# Patient Record
Sex: Female | Born: 1990 | Race: White | Hispanic: No | Marital: Single | Smoking: Never smoker
Health system: Southern US, Community
[De-identification: ages and names within clinical notes are randomized; demographics above are authoritative.]

## PROBLEM LIST (undated history)

## (undated) HISTORY — PX: WISDOM TOOTH EXTRACTION: SHX21

---
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
## 2012-11-18 ENCOUNTER — Encounter (HOSPITAL_COMMUNITY): Payer: Self-pay | Admitting: Emergency Medicine

## 2012-11-18 ENCOUNTER — Emergency Department (HOSPITAL_COMMUNITY)
Admission: EM | Admit: 2012-11-18 | Discharge: 2012-11-19 | Disposition: A | Discharge status: Home / Self Care | Payer: 59 | Attending: Emergency Medicine | Admitting: Emergency Medicine

## 2012-11-18 DIAGNOSIS — H5711 Ocular pain, right eye: Secondary | ICD-10-CM

## 2012-11-18 DIAGNOSIS — H5710 Ocular pain, unspecified eye: Secondary | ICD-10-CM | POA: Insufficient documentation

## 2012-11-18 NOTE — ED Notes (Signed)
Pt states she has been having problems with her eyes for the past couple of months  Pt states she went to the dr and got drops for them and her left one got better but not the right one  Pt states today she has had a lot of pressure in her right eye

## 2012-11-19 ENCOUNTER — Emergency Department (HOSPITAL_COMMUNITY): Payer: 59

## 2012-11-19 ENCOUNTER — Encounter (HOSPITAL_COMMUNITY): Payer: Self-pay

## 2012-11-19 MED ORDER — IOHEXOL 300 MG/ML  SOLN
100.0000 mL | Freq: Once | INTRAMUSCULAR | Status: AC | PRN
  Administered 2012-11-19: 100 mL via INTRAVENOUS

## 2012-11-19 MED ORDER — TETRACAINE HCL 0.5 % OP SOLN
1.0000 [drp] | Freq: Once | OPHTHALMIC | Status: AC
  Administered 2012-11-19: 1 [drp] via OPHTHALMIC
  Filled 2012-11-19: qty 2

## 2012-11-19 MED ORDER — HYDROCODONE-ACETAMINOPHEN 5-325 MG PO TABS: 1.0000 | ORAL_TABLET | ORAL | Status: DC | PRN

## 2012-11-19 NOTE — ED Provider Notes (Signed)
CSN: [id number]     Arrival date & time 11/18/12  2332 History   First MD Initiated Contact with Patient 11/19/12 0016     Chief Complaint  Patient presents with  . Eye Pain   (Consider location/radiation/quality/duration/timing/severity/associated sxs/prior Treatment) HPI History provided by pt.   Pt c/o right pain x 2 days.  Saw her eye doctor 2 months ago for bilateral eye drainage.  Discontinued abx drops when sx resolved, but developed am drainage on the right side again 2 weeks ago.  Associated w/ injection as well as 2 days of pain that she describes as pressure in globe.  Denies fever, blurred vision, diplopia, floaters, loss of peripheral vision,  abd pain, N/V.  Denies trauma.  FH glaucoma.   History reviewed. No pertinent past medical history. Past Surgical History  Procedure Laterality Date  . Wisdom tooth extraction     History reviewed. No pertinent family history. History  Substance Use Topics  . Smoking status: Never Smoker   . Smokeless tobacco: Never Used  . Alcohol Use: No   OB History   Grav Para Term Preterm Abortions TAB SAB Ect Mult Living                 Review of Systems  All other systems reviewed and are negative.    Allergies  Review of patient's allergies indicates no known allergies.  Home Medications   Current Outpatient Rx  Name  Route  Sig  Dispense  Refill  . acetaminophen (TYLENOL) 325 MG tablet   Oral   Take 650 mg by mouth every 6 (six) hours as needed. pain         . ibuprofen (ADVIL,MOTRIN) 800 MG tablet   Oral   Take 1 tablet (800 mg total) by mouth every 8 (eight) hours as needed for pain.   21 tablet   0   . trimethoprim-polymyxin b (POLYTRIM) ophthalmic solution   Ophthalmic   Apply 1 drop to eye every 4 (four) hours. Had this filled in August          BP 122/64  Pulse 82  Temp(Src) 98.4 F (36.9 C) (Oral)  Resp 14  SpO2 98%  LMP 11/15/2012 Physical Exam  Nursing note and vitals reviewed. Constitutional:  She is oriented to person, place, and time. She appears well-developed and well-nourished. No distress.  HENT:  Head: Normocephalic and atraumatic.  Eyes: EOM are normal. Pupils are equal, round, and reactive to light. Right eye exhibits no discharge. Left eye exhibits no discharge.  Injection lower half of right conjunctiva.  Tenderness of globe.  Pain w/ upward and lateral gaze.  IOP .  Visual acuity 20/15 L and 20/25 R  Neck: Normal range of motion.  Pulmonary/Chest: Effort normal.  Musculoskeletal: Normal range of motion.  Neurological: She is alert and oriented to person, place, and time.  Psychiatric: She has a normal mood and affect. Her behavior is normal.    ED Course  Procedures (including critical care time) Labs Review Labs Reviewed - No data to display Imaging Review No results found.  EKG Interpretation   None       MDM   1. Pain in right eye    Healthy 22yo F presents w/ non-traumatic right eye pain and injection.  No vision changes.  FH glaucoma. Hx/Exam not consistent w/ conjunctivitis, acute angle closure glaucoma, anterior uveitis.  Pain w/ movement.  CT orbits ordered to r/o post-septal cellulitis.  1:15 AM   CT negative.  Will treat symptomatically for what is likely episcleritis, and advise f/u with ophtho.  Prescribed vicodin and recommended artificial tears as well.  Strict return precautions discussed. 4:02 AM    Otilio Miu, PA-C 11/19/12 0430

## 2012-11-19 NOTE — ED Provider Notes (Signed)
Medical screening examination/treatment/procedure(s) were performed by non-physician practitioner and as supervising physician I was immediately available for consultation/collaboration.  EKG Interpretation   None         Lyanne Co, MD 11/19/12 [phone]

## 2012-11-19 NOTE — ED Notes (Signed)
Bed: WA06 Expected date:  Expected time:  Means of arrival:  Comments: 

## 2012-11-19 NOTE — ED Notes (Signed)
Patient transported to CT 

## 2013-04-02 IMAGING — CR DG ANKLE COMPLETE 3+V*R*
3 series · 3 of 3 positions shown · non-contrast
Comparison: None.

CLINICAL DATA: Lateral right ankle pain and swelling after fall.

RIGHT ANKLE - COMPLETE 3+ VIEW

[x ankle ap right]
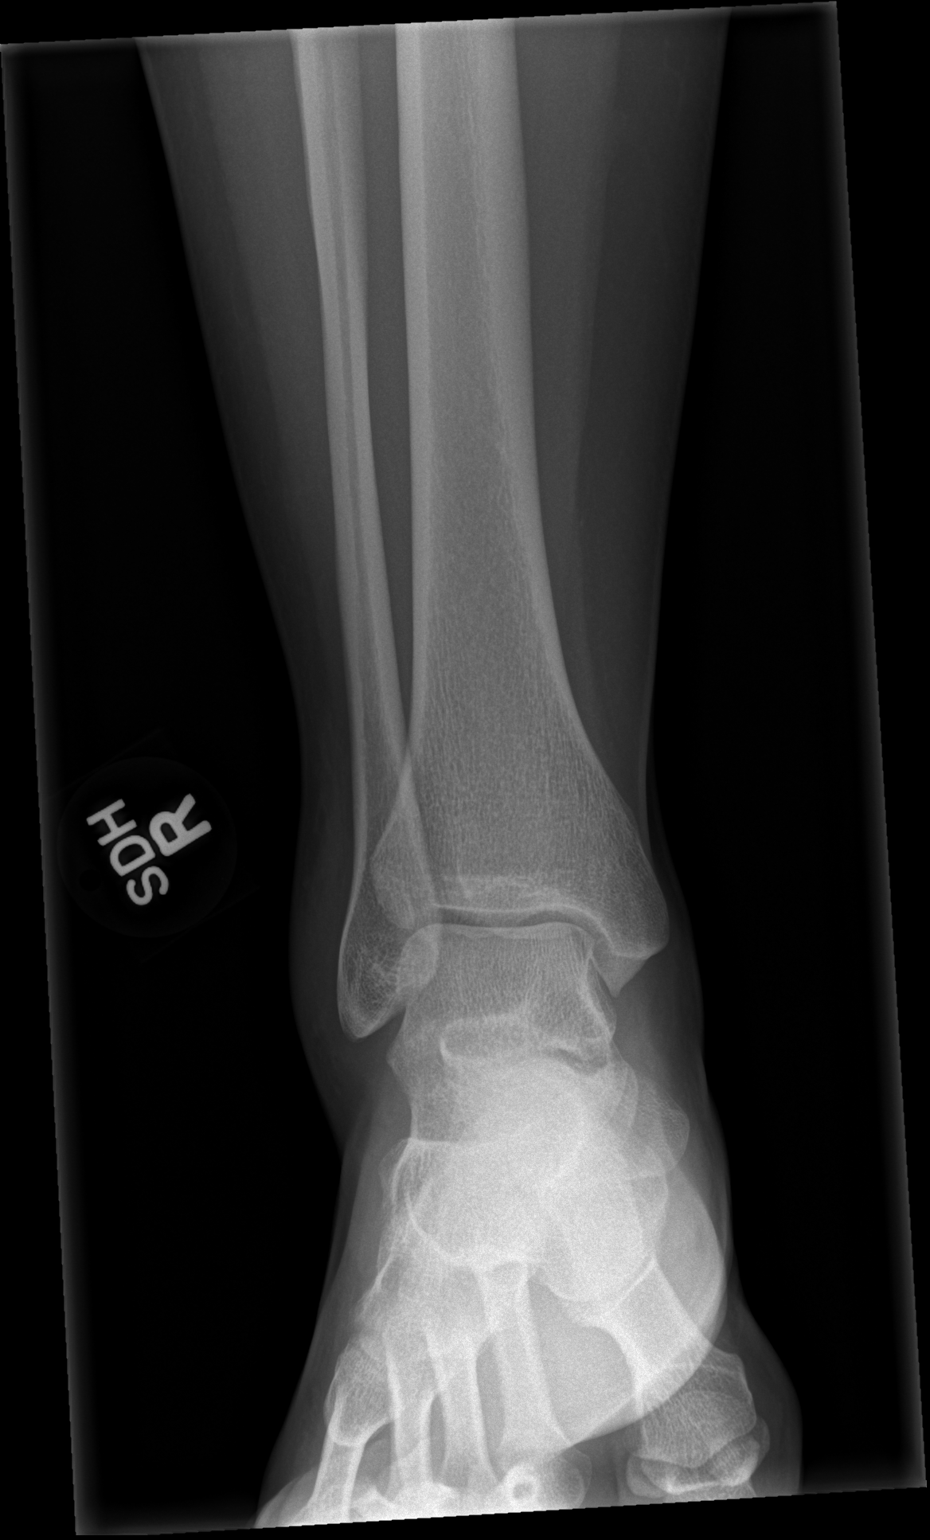

[x ankle obl right]
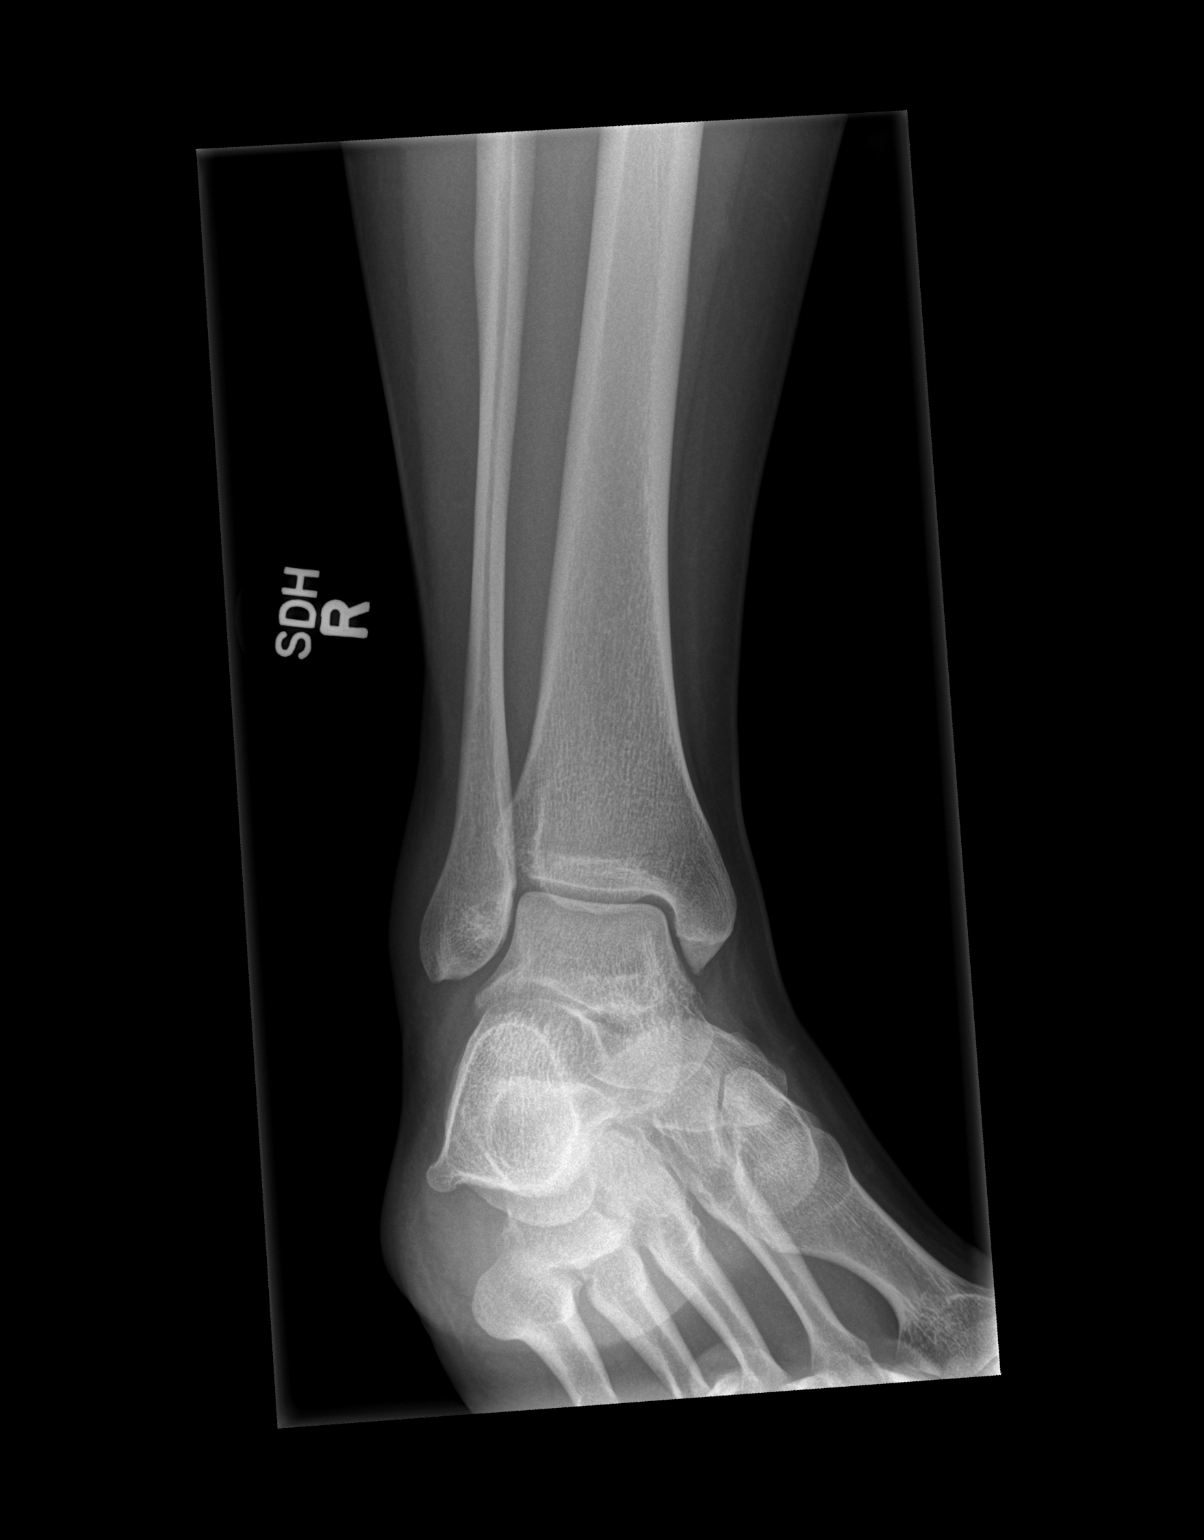

[x ankle lat right]
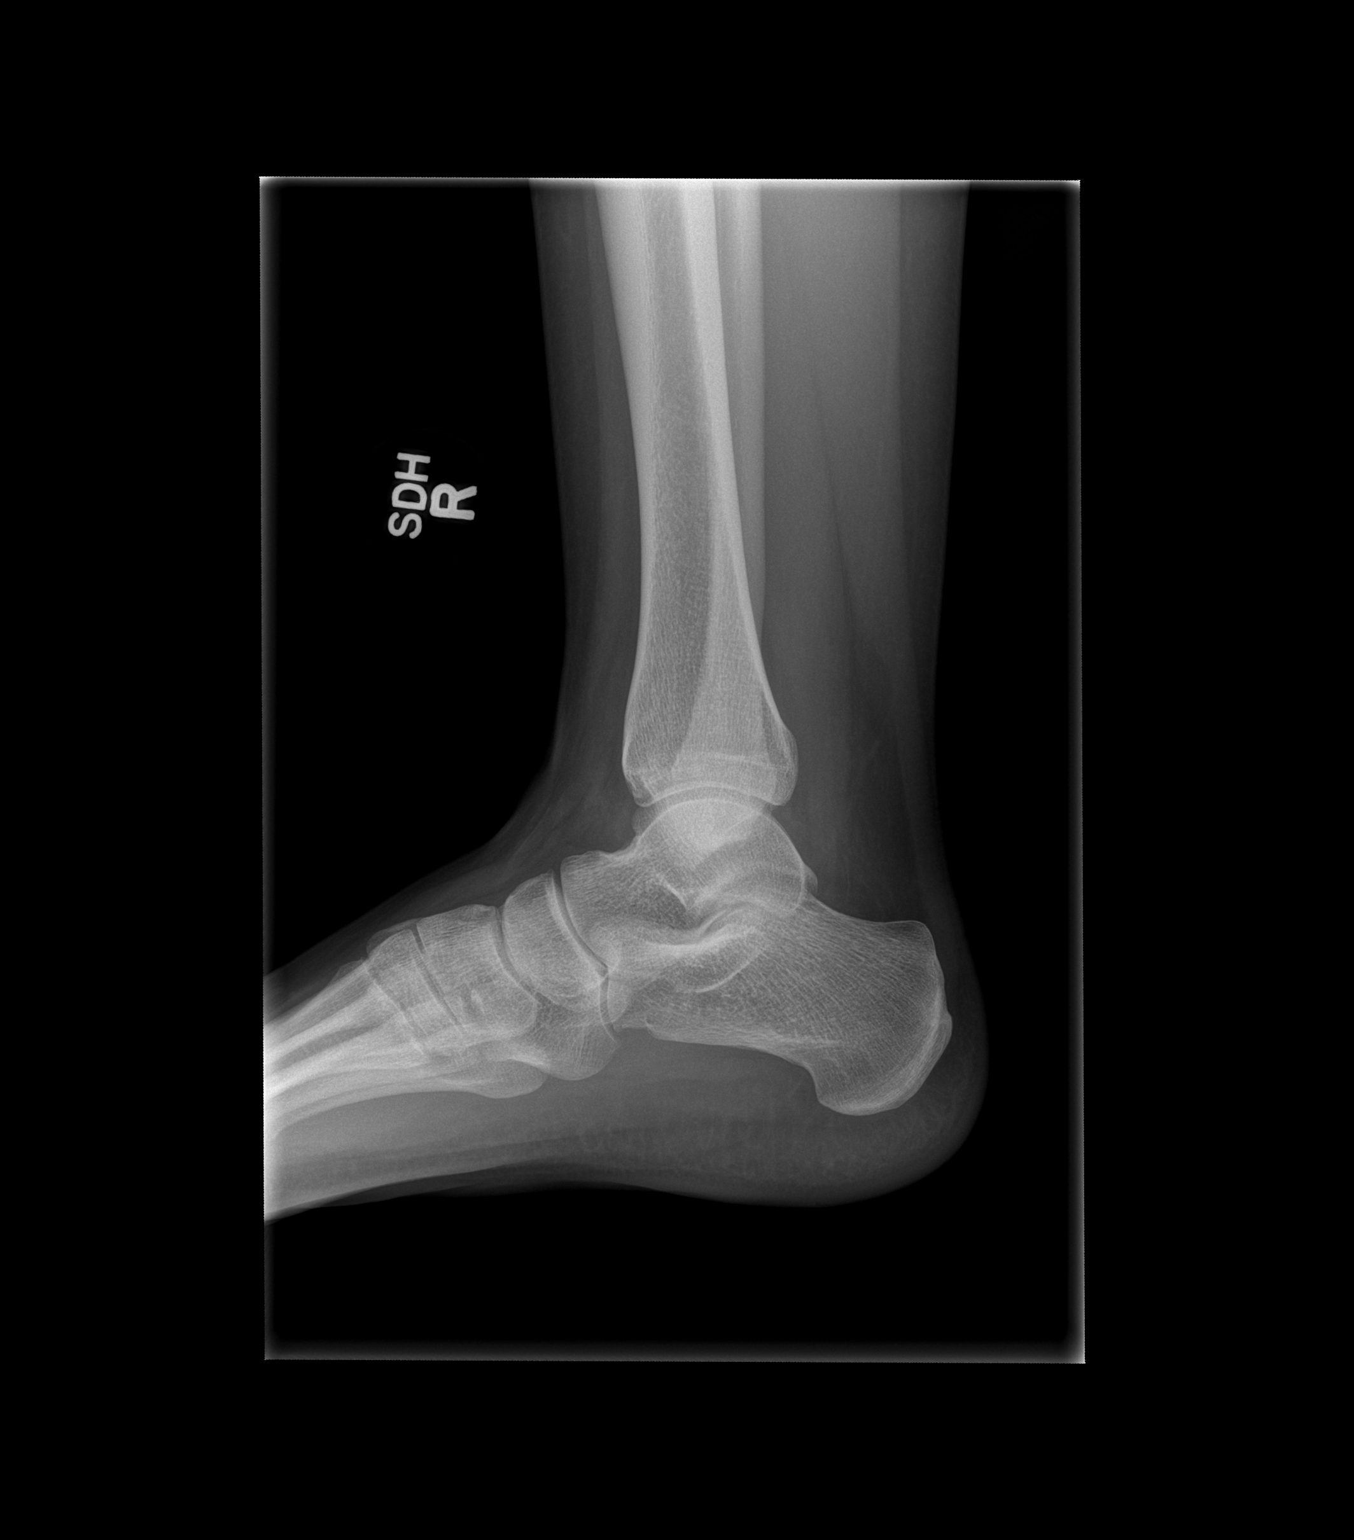

[3 of 3 positions shown; findings below may reference images not displayed]

FINDINGS: Lateral soft tissue swelling over the right ankle.  No
evidence of acute fracture or subluxation.  Ankle mortise appears
intact.  No focal bone lesion or bone destruction.  Bone cortex and
trabecular architecture appear intact.  No radiopaque soft tissue
foreign bodies.
IMPRESSION: Lateral soft tissue swelling.  No acute bony abnormalities.

## 2014-09-28 ENCOUNTER — Emergency Department (HOSPITAL_COMMUNITY)
Admission: EM | Admit: 2014-09-28 | Discharge: 2014-09-28 | Disposition: A | Discharge status: Home / Self Care | Payer: 59 | Attending: Emergency Medicine | Admitting: Emergency Medicine

## 2014-09-28 ENCOUNTER — Encounter (HOSPITAL_COMMUNITY): Payer: Self-pay | Admitting: Emergency Medicine

## 2014-09-28 ENCOUNTER — Emergency Department (HOSPITAL_COMMUNITY): Payer: 59

## 2014-09-28 DIAGNOSIS — Z79899 Other long term (current) drug therapy: Secondary | ICD-10-CM | POA: Insufficient documentation

## 2014-09-28 DIAGNOSIS — R0602 Shortness of breath: Secondary | ICD-10-CM | POA: Insufficient documentation

## 2014-09-28 DIAGNOSIS — R05 Cough: Secondary | ICD-10-CM | POA: Insufficient documentation

## 2014-09-28 DIAGNOSIS — Z3202 Encounter for pregnancy test, result negative: Secondary | ICD-10-CM | POA: Insufficient documentation

## 2014-09-28 LAB — POC URINE PREG, ED: PREG TEST UR: NEGATIVE

## 2014-09-28 NOTE — ED Notes (Signed)
Pt ambulating independently w/ steady gait on d/c in no acute distress, A&Ox4. D/c instructions reviewed w/ pt - pt denies any further questions or concerns at present.  

## 2014-09-28 NOTE — ED Notes (Signed)
Pt cannot use restroom at this time, aware specimen is needed. 

## 2014-09-28 NOTE — ED Notes (Addendum)
Pt first reports she "gave herself alcohol poisoning on Friday night and woke up Saturday to vomit on her pillow." Pt is concerned she aspirated. SOB started Saturday morning and has increasingly gotten worse since then. Denies productive cough. 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 RR even/unlabored in triage. Is on oral contraceptives-has been on this for 6 months. Pt is very active and does not smoke but says, "something just isn't right and it feels like there are rocks in my chest." No other c/c.

## 2014-09-28 NOTE — ED Provider Notes (Signed)
CSN: [id number]     Arrival date & time 09/28/14  2015 History   First MD Initiated Contact with Patient 09/28/14 2035     Chief Complaint  Patient presents with  . Shortness of Breath  . Chemical Exposure  . Oral Contraceptive Use      (Consider location/radiation/quality/duration/timing/severity/associated sxs/prior Treatment) Patient is a 24 y.o. female presenting with cough.  Cough Cough characteristics:  Non-productive Severity:  Mild Onset quality:  Gradual Duration:  2 days Timing:  Intermittent Chronicity:  New Smoker: no   Context: not animal exposure and not exposure to allergens   Relieved by:  None tried Worsened by:  Nothing tried Ineffective treatments:  None tried Associated symptoms: shortness of breath   Associated symptoms: no chills, no ear fullness, no fever, no myalgias, no rash, no sinus congestion and no sore throat   Risk factors: chemical exposure     History reviewed. No pertinent past medical history. Past Surgical History  Procedure Laterality Date  . Wisdom tooth extraction     History reviewed. No pertinent family history. Social History  Substance Use Topics  . Smoking status: Never Smoker   . Smokeless tobacco: Never Used  . Alcohol Use: No   OB History    No data available     Review of Systems  Constitutional: Negative for fever and chills.  HENT: Negative for sore throat.   Eyes: Negative for pain.  Respiratory: Positive for cough and shortness of breath.   Gastrointestinal: Negative for nausea, vomiting and diarrhea.  Musculoskeletal: Negative for myalgias and neck pain.  Skin: Negative for rash.  All other systems reviewed and are negative.     Allergies  Review of patient's allergies indicates no known allergies.  Home Medications   Prior to Admission medications   Medication Sig Start Date End Date Taking? Authorizing Provider  acetaminophen (TYLENOL) 325 MG tablet Take 650 mg by mouth every 6 (six) hours as  needed. pain   Yes Historical Provider, MD  APRI 0.15-30 MG-MCG tablet Take 1 tablet by mouth daily. 07/18/14  Yes Historical Provider, MD   BP 120/62 mmHg  Pulse 72  Temp(Src) 98.3 F (36.8 C) (Oral)  Resp 13  SpO2 100%  LMP 09/14/2014 Physical Exam  Constitutional: She is oriented to person, place, and time. She appears well-developed and well-nourished.  HENT:  Head: Normocephalic and atraumatic.  Eyes: Conjunctivae and EOM are normal. Right eye exhibits no discharge. Left eye exhibits no discharge.  Cardiovascular: Normal rate and regular rhythm.   Pulmonary/Chest: Effort normal and breath sounds normal. No respiratory distress.  Abdominal: Soft. She exhibits no distension. There is no tenderness. There is no rebound.  Musculoskeletal: Normal range of motion. She exhibits no edema or tenderness.  Neurological: She is alert and oriented to person, place, and time.  Skin: Skin is warm and dry.  Nursing note and vitals reviewed.   ED Course  Procedures (including critical care time) Labs Review Labs Reviewed  POC URINE PREG, ED    Imaging Review Dg Chest 2 View  09/28/2014   CLINICAL DATA:  24 year old female with shortness of breath. Evaluate for pneumothorax.  EXAM: CHEST  2 VIEW  COMPARISON:  None.  FINDINGS: The heart size and mediastinal contours are within normal limits. Both lungs are clear. The visualized skeletal structures are unremarkable.  IMPRESSION: No acute cardiopulmonary process.  No pneumothorax.   Electronically Signed   By: Elgie Collard M.D.   On: 09/28/2014 22:03   I  have personally reviewed and evaluated these images and lab results as part of my medical decision-making.   EKG Interpretation   Date/Time:  Monday September 28 2014 20:34:36 EDT Ventricular Rate:  80 PR Interval:  144 QRS Duration: 103 QT Interval:  366 QTC Calculation: 422 R Axis:   82 Text Interpretation:  Sinus rhythm RSR' in V1 or V2, right VCD or RVH  Confirmed by [facility] MD,  Barbara Cower [phone]) on 09/28/2014 8:42:52 PM      MDM   Final diagnoses:  Shortness of breath   Patient with nondescript upper chest pain after having vomiting episode secondary to intoxication a couple days ago. Also some shortness of breath. Exam is benign no rash. X-ray negative for any evidence of aspiration or mediastinal air. Could just be reflux esophagitis from the vomiting episode. Doubt PE, ACS pneumothorax or other causes at this time. We will ask her to return here for new or worsening symptoms.  I have personally and contemperaneously reviewed labs and imaging and used in my decision making as above.   A medical screening exam was performed and I feel the patient has had an appropriate workup for their chief complaint at this time and likelihood of emergent condition existing is low. They have been counseled on decision, discharge, follow up and which symptoms necessitate immediate return to the emergency department. They or their family verbally stated understanding and agreement with plan and discharged in stable condition.      Marily Memos, MD 09/29/14 [phone]

## 2016-01-14 IMAGING — CR DG CHEST 2V
2 series · 2 of 2 positions shown · non-contrast
Comparison: None.

CLINICAL DATA: 24-year-old female with shortness of breath.
Evaluate for pneumothorax.

EXAM:
CHEST  2 VIEW

[w chest pa]
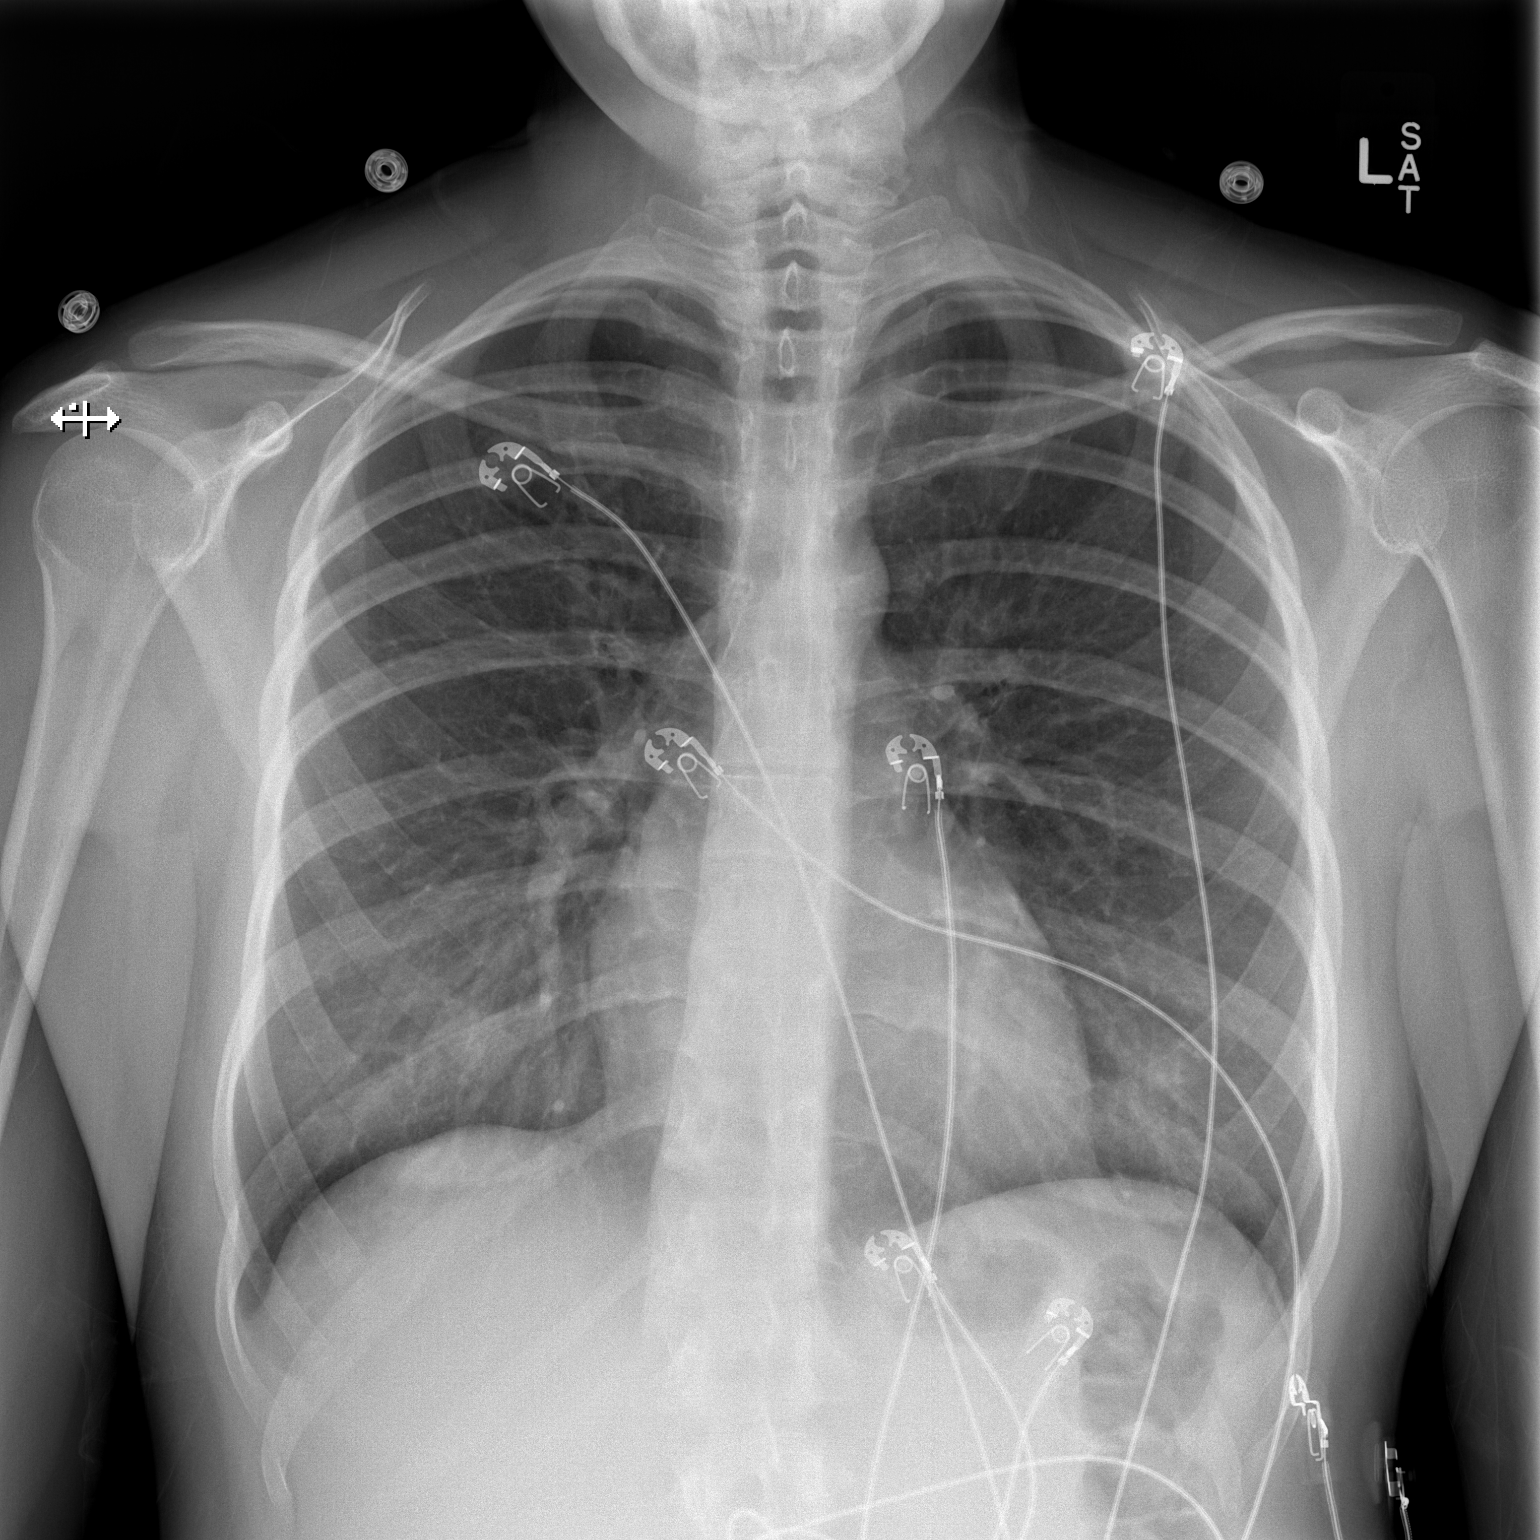

[w chest lat]
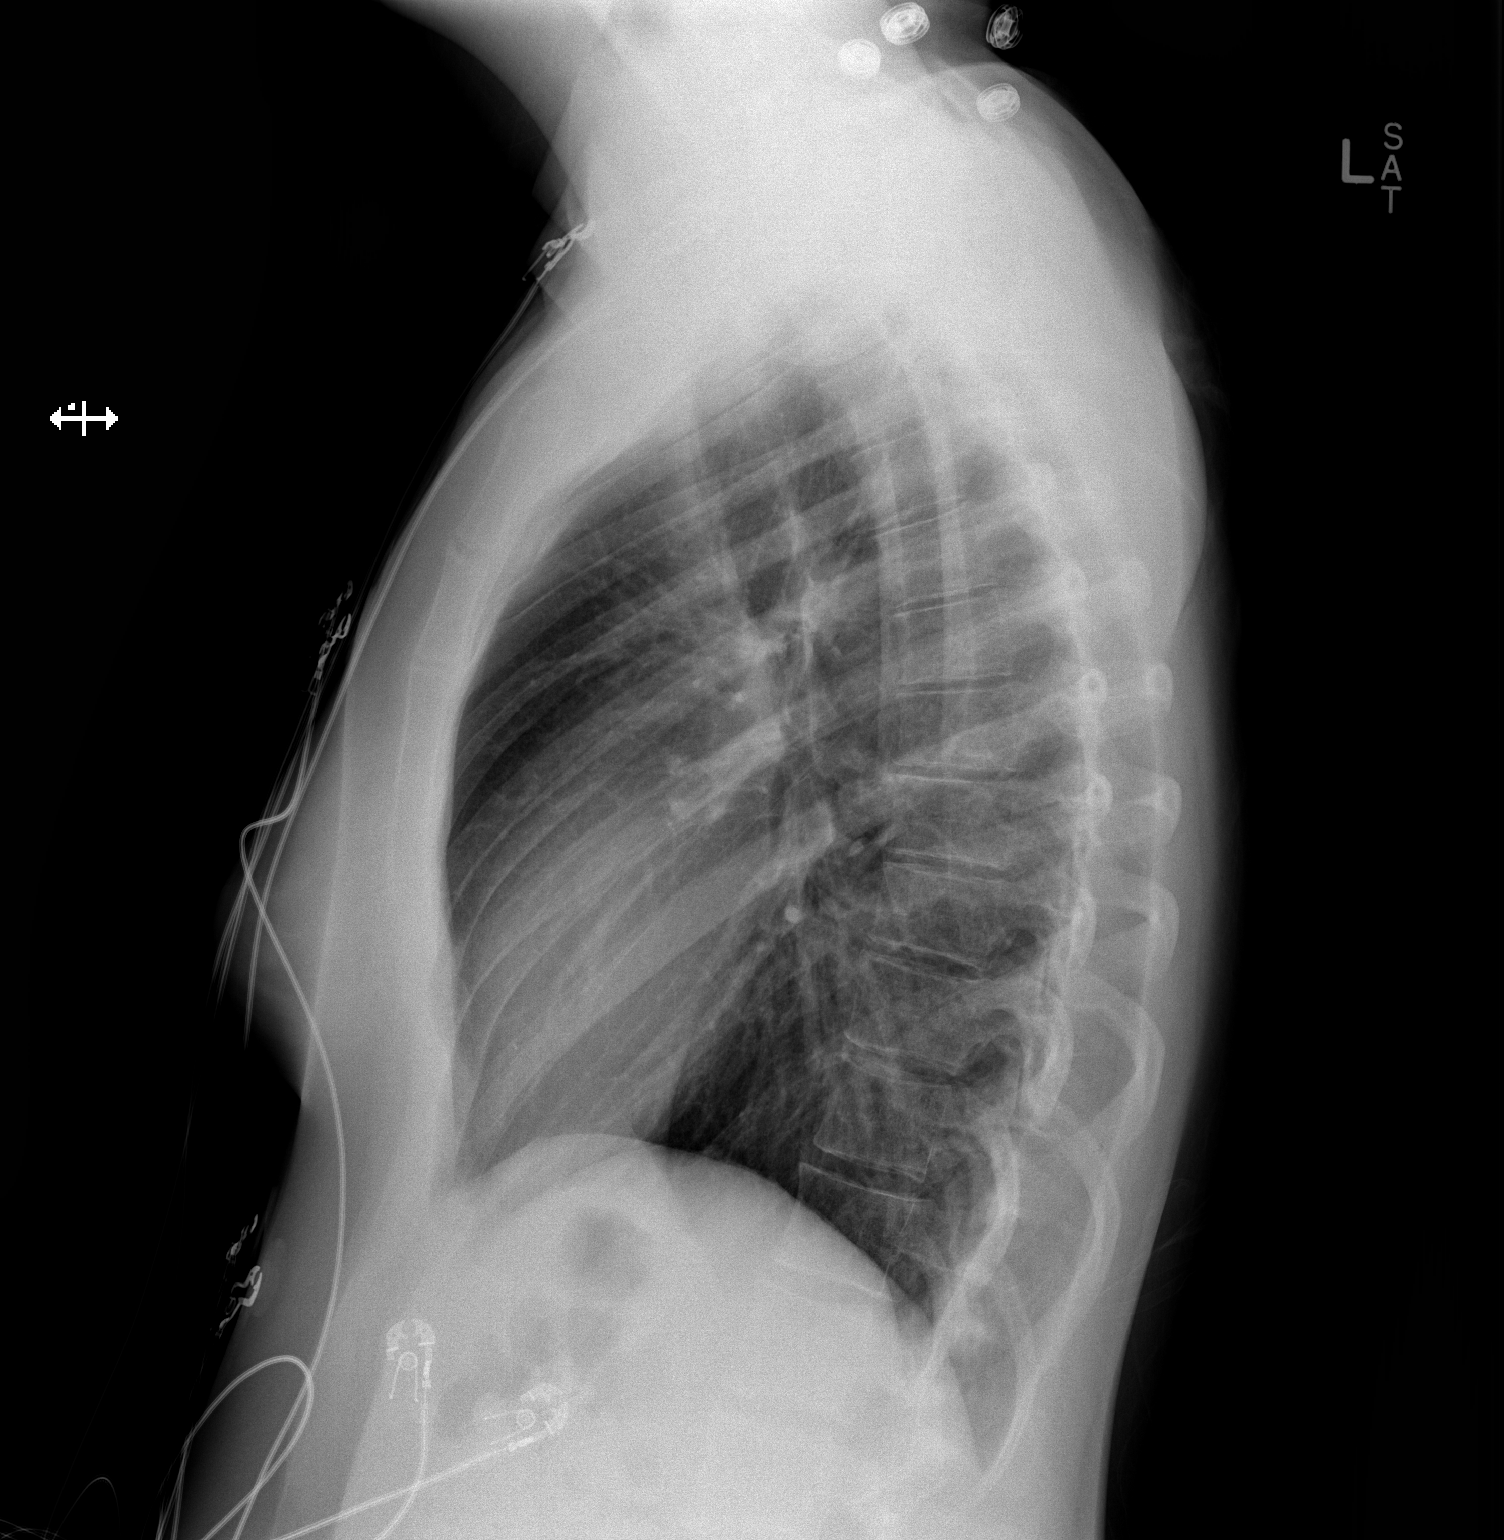

[2 of 2 positions shown; findings below may reference images not displayed]

FINDINGS: The heart size and mediastinal contours are within normal limits.
Both lungs are clear. The visualized skeletal structures are
unremarkable.
IMPRESSION: No acute cardiopulmonary process.  No pneumothorax.
